# Patient Record
Sex: Female | Born: 2000 | Race: Asian | Hispanic: No | Marital: Single | Smoking: Never smoker
Health system: Southern US, Community
[De-identification: ages and names within clinical notes are randomized; demographics above are authoritative.]

## PROBLEM LIST (undated history)

## (undated) HISTORY — PX: NO PAST SURGERIES: SHX2092

---

## 2021-09-26 ENCOUNTER — Encounter (HOSPITAL_BASED_OUTPATIENT_CLINIC_OR_DEPARTMENT_OTHER): Payer: Self-pay

## 2021-09-26 ENCOUNTER — Other Ambulatory Visit: Payer: Self-pay

## 2021-09-26 ENCOUNTER — Emergency Department (HOSPITAL_BASED_OUTPATIENT_CLINIC_OR_DEPARTMENT_OTHER): Payer: 59

## 2021-09-26 ENCOUNTER — Emergency Department (HOSPITAL_BASED_OUTPATIENT_CLINIC_OR_DEPARTMENT_OTHER): Payer: 59 | Admitting: Radiology

## 2021-09-26 ENCOUNTER — Emergency Department (HOSPITAL_BASED_OUTPATIENT_CLINIC_OR_DEPARTMENT_OTHER)
Admission: EM | Admit: 2021-09-26 | Discharge: 2021-09-26 | Disposition: A | Discharge status: Home / Self Care | Payer: 59 | Attending: Emergency Medicine | Admitting: Emergency Medicine

## 2021-09-26 DIAGNOSIS — K8020 Calculus of gallbladder without cholecystitis without obstruction: Secondary | ICD-10-CM | POA: Diagnosis not present

## 2021-09-26 DIAGNOSIS — R1011 Right upper quadrant pain: Secondary | ICD-10-CM

## 2021-09-26 DIAGNOSIS — K8050 Calculus of bile duct without cholangitis or cholecystitis without obstruction: Secondary | ICD-10-CM

## 2021-09-26 DIAGNOSIS — R109 Unspecified abdominal pain: Secondary | ICD-10-CM | POA: Diagnosis present

## 2021-09-26 LAB — COMPREHENSIVE METABOLIC PANEL
ALT: 17 U/L (ref 0–44)
AST: 14 U/L — ABNORMAL LOW (ref 15–41)
Albumin: 4.6 g/dL (ref 3.5–5.0)
Alkaline Phosphatase: 46 U/L (ref 38–126)
Anion gap: 10 (ref 5–15)
BUN: 19 mg/dL (ref 6–20)
CO2: 24 mmol/L (ref 22–32)
Calcium: 9.4 mg/dL (ref 8.9–10.3)
Chloride: 105 mmol/L (ref 98–111)
Creatinine, Ser: 0.5 mg/dL (ref 0.44–1.00)
GFR, Estimated: >60 mL/min (ref >60)
Glucose, Bld: 105 mg/dL — ABNORMAL HIGH (ref 70–99)
Potassium: 3.7 mmol/L (ref 3.5–5.1)
Sodium: 139 mmol/L (ref 135–145)
Total Bilirubin: 0.3 mg/dL (ref 0.3–1.2)
Total Protein: 7.8 g/dL (ref 6.5–8.1)

## 2021-09-26 LAB — URINALYSIS, ROUTINE W REFLEX MICROSCOPIC
Bilirubin Urine: NEGATIVE
Glucose, UA: NEGATIVE mg/dL
Hgb urine dipstick: NEGATIVE
Ketones, ur: NEGATIVE mg/dL
Leukocytes,Ua: NEGATIVE
Nitrite: NEGATIVE
Protein, ur: NEGATIVE mg/dL
Specific Gravity, Urine: 1.022 (ref 1.005–1.030)
pH: 6.5 (ref 5.0–8.0)

## 2021-09-26 LAB — CBC WITH DIFFERENTIAL/PLATELET
Abs Immature Granulocytes: 0.02 10*3/uL (ref 0.00–0.07)
Basophils Absolute: 0 10*3/uL (ref 0.0–0.1)
Basophils Relative: 0 %
Eosinophils Absolute: 0.1 10*3/uL (ref 0.0–0.5)
Eosinophils Relative: 1 %
HCT: 42 % (ref 36.0–46.0)
Hemoglobin: 14 g/dL (ref 12.0–15.0)
Immature Granulocytes: 0 %
Lymphocytes Relative: 26 %
Lymphs Abs: 2.5 10*3/uL (ref 0.7–4.0)
MCH: 29.4 pg (ref 26.0–34.0)
MCHC: 33.3 g/dL (ref 30.0–36.0)
MCV: 88.2 fL (ref 80.0–100.0)
Monocytes Absolute: 0.7 10*3/uL (ref 0.1–1.0)
Monocytes Relative: 7 %
Neutro Abs: 6.5 10*3/uL (ref 1.7–7.7)
Neutrophils Relative %: 66 %
Platelets: 318 10*3/uL (ref 150–400)
RBC: 4.76 MIL/uL (ref 3.87–5.11)
RDW: 11.6 % (ref 11.5–15.5)
WBC: 9.8 10*3/uL (ref 4.0–10.5)
nRBC: 0 % (ref 0.0–0.2)

## 2021-09-26 LAB — LIPASE, BLOOD: Lipase: 39 U/L (ref 11–51)

## 2021-09-26 LAB — PREGNANCY, URINE: Preg Test, Ur: NEGATIVE

## 2021-09-26 MED ORDER — ONDANSETRON HCL 4 MG/2ML IJ SOLN
4.0000 mg | Freq: Once | INTRAMUSCULAR | Status: AC
  Administered 2021-09-26: 4 mg via INTRAVENOUS
  Filled 2021-09-26: qty 2

## 2021-09-26 MED ORDER — SODIUM CHLORIDE 0.9 % IV BOLUS
1000.0000 mL | Freq: Once | INTRAVENOUS | Status: AC
  Administered 2021-09-26: 1000 mL via INTRAVENOUS

## 2021-09-26 MED ORDER — ALUM & MAG HYDROXIDE-SIMETH 200-200-20 MG/5ML PO SUSP
30.0000 mL | Freq: Once | ORAL | Status: AC
  Administered 2021-09-26: 30 mL via ORAL
  Filled 2021-09-26: qty 30

## 2021-09-26 MED ORDER — OMEPRAZOLE 20 MG PO CPDR: 20.0000 mg | DELAYED_RELEASE_CAPSULE | Freq: Every day | ORAL | 0 refills | Status: DC

## 2021-09-26 MED ORDER — LIDOCAINE VISCOUS HCL 2 % MT SOLN
15.0000 mL | Freq: Once | OROMUCOSAL | Status: AC
  Administered 2021-09-26: 15 mL via ORAL
  Filled 2021-09-26: qty 15

## 2021-09-26 MED ORDER — ONDANSETRON 4 MG PO TBDP: 4.0000 mg | ORAL_TABLET | Freq: Three times a day (TID) | ORAL | 0 refills | Status: DC | PRN

## 2021-09-26 NOTE — Discharge Instructions (Signed)
Your ultrasound shows gallstones.  You should follow-up with the surgeon for removal of your gallbladder.  Avoid alcohol, caffeine, spicy foods, NSAID medications.  Return to the ED with worsening pain, fever, vomiting, any other concerns

## 2021-09-26 NOTE — ED Provider Notes (Signed)
[facility] EMERGENCY DEPT Provider Note   CSN: [id number] Arrival date & time: 09/26/21  0346     History Chief Complaint  Patient presents with   Abdominal Pain   Emesis    Sandra Koch is a 20 y.o. female.  Level 5 caveat for language barrier.  Patient wishes to use her sister as Nurse, learning disability and does not wish to use a Orthoptist. She presents with upper abdominal pain for the past 1 week that is progressively worsening.  The pain appears only at night and appears to wake her from sleep.  States the pain last from 2 AM until 6 AM constantly.  It resolves during the day and she is able to eat and drink normally.  She has had several episodes of vomiting with this pain on and off over the past week.  Denies any blood in her emesis.  Denies any black or bloody stools.  No fever.  No pain with urination or blood in the urine.  No vaginal bleeding or discharge. No history of acid reflux or ulcers.  Still has appendix and gallbladder. Denies any excessive alcohol or NSAID use.  The history is provided by the patient. The history is limited by a language barrier. A language interpreter was used.  Abdominal Pain Associated symptoms: nausea and vomiting   Associated symptoms: no chest pain, no cough, no diarrhea, no dysuria, no fever, no hematuria, no shortness of breath, no vaginal bleeding and no vaginal discharge   Emesis Associated symptoms: abdominal pain   Associated symptoms: no arthralgias, no cough, no diarrhea, no fever, no headaches and no myalgias       History reviewed. No pertinent past medical history.  There are no problems to display for this patient.   History reviewed. No pertinent surgical history.   OB History   No obstetric history on file.     No family history on file.  Social History   Tobacco Use   Smoking status: Never   Smokeless tobacco: Never    Home Medications Prior to Admission medications   Not on File     Allergies    Patient has no allergy information on record.  Review of Systems   Review of Systems  Constitutional:  Negative for activity change, appetite change and fever.  HENT:  Negative for congestion and rhinorrhea.   Respiratory:  Negative for cough, chest tightness and shortness of breath.   Cardiovascular:  Negative for chest pain.  Gastrointestinal:  Positive for abdominal pain, nausea and vomiting. Negative for diarrhea.  Genitourinary:  Negative for dysuria, hematuria, urgency, vaginal bleeding and vaginal discharge.  Musculoskeletal:  Negative for arthralgias and myalgias.  Neurological:  Negative for dizziness, weakness and headaches.   all other systems are negative except as noted in the HPI and PMH.   Physical Exam Updated Vital Signs BP (!) 140/93   Pulse 64   Temp 98.3 F (36.8 C) (Oral)   Resp 18   Ht 5\' 2"  (1.575 m)   Wt 56.7 kg   LMP 09/15/2021 (Exact Date)   SpO2 100%   BMI 22.86 kg/m   Physical Exam Vitals and nursing note reviewed.  Constitutional:      General: She is not in acute distress.    Appearance: She is well-developed.  HENT:     Head: Normocephalic and atraumatic.     Mouth/Throat:     Pharynx: No oropharyngeal exudate.  Eyes:     Conjunctiva/sclera: Conjunctivae normal.  Pupils: Pupils are equal, round, and reactive to light.  Neck:     Comments: No meningismus. Cardiovascular:     Rate and Rhythm: Normal rate and regular rhythm.     Heart sounds: Normal heart sounds. No murmur heard. Pulmonary:     Effort: Pulmonary effort is normal. No respiratory distress.     Breath sounds: Normal breath sounds.  Abdominal:     Palpations: Abdomen is soft.     Tenderness: There is abdominal tenderness. There is no guarding or rebound.     Comments: Epigastric and right upper quadrant tenderness, no guarding or rebound.  No lower abdominal tenderness  Musculoskeletal:        General: No tenderness. Normal range of motion.      Cervical back: Normal range of motion and neck supple.  Skin:    General: Skin is warm.  Neurological:     Mental Status: She is alert and oriented to person, place, and time.     Cranial Nerves: No cranial nerve deficit.     Motor: No abnormal muscle tone.     Coordination: Coordination normal.     Comments:  5/5 strength throughout. CN 2-12 intact.Equal grip strength.   Psychiatric:        Behavior: Behavior normal.    ED Results / Procedures / Treatments   Labs (all labs ordered are listed, but only abnormal results are displayed) Labs Reviewed  COMPREHENSIVE METABOLIC PANEL - Abnormal; Notable for the following components:      Result Value   Glucose, Bld 105 (*)    AST 14 (*)    All other components within normal limits  CBC WITH DIFFERENTIAL/PLATELET  LIPASE, BLOOD  URINALYSIS, ROUTINE W REFLEX MICROSCOPIC  PREGNANCY, URINE    EKG None  Radiology DG Abdomen Acute W/Chest  Result Date: 09/26/2021 CLINICAL DATA:  Abdominal pain. EXAM: DG ABDOMEN ACUTE WITH 1 VIEW CHEST COMPARISON:  None. FINDINGS: The cardiac silhouette, mediastinal and hilar contours are normal. The lungs are clear. No pleural effusions. Scattered air in the small bowel and colon but no bowel distension. No free air. The soft tissue shadows are maintained. No worrisome calcifications. The bony structures are unremarkable. IMPRESSION: 1. No acute cardiopulmonary findings. 2. Unremarkable abdominal radiographs. No findings for obstruction or perforation. Electronically Signed   By: Rudie Meyer M.D.   On: 09/26/2021 06:45   US Abdomen Limited RUQ (LIVER/GB)  Result Date: 09/26/2021 CLINICAL DATA:  Right upper quadrant pain EXAM: ULTRASOUND ABDOMEN LIMITED RIGHT UPPER QUADRANT COMPARISON:  None. FINDINGS: Gallbladder: Shadowing gallstones. Mild gallbladder wall thickening and heterogeneity, up to 5 mm in thickness. Modest distension of the gallbladder. No focal tenderness by sonographer exam. Common bile  duct: Diameter: 4 mm Liver: No focal lesion identified. Within normal limits in parenchymal echogenicity. Portal vein is patent on color Doppler imaging with normal direction of blood flow towards the liver. IMPRESSION: Cholelithiasis. Mild gallbladder wall thickening but no focal tenderness typical of acute cholecystitis. Electronically Signed   By: Tiburcio Pea M.D.   On: 09/26/2021 06:22    Procedures Procedures   Medications Ordered in ED Medications  alum & mag hydroxide-simeth (MAALOX/MYLANTA) 200-200-20 MG/5ML suspension 30 mL (has no administration in time range)    And  lidocaine (XYLOCAINE) 2 % viscous mouth solution 15 mL (has no administration in time range)  ondansetron (ZOFRAN) injection 4 mg (has no administration in time range)  sodium chloride 0.9 % bolus 1,000 mL (has no administration in time range)  ED Course  I have reviewed the triage vital signs and the nursing notes.  Pertinent labs & imaging results that were available during my care of the patient were reviewed by me and considered in my medical decision making (see chart for details).    MDM Rules/Calculators/A&P                          Upper abdominal pain with intermittent nausea and vomiting for the past 1 week and appears only at night. Vital stable.  Abdominal soft without peritoneal signs  Consider gastritis, esophagitis, pancreatitis, gallbladder etiology of pain.  Labs show no leukocytosis.  LFTs and lipase are normal.  Ultrasound shows gallstones and mild gallbladder wall thickening but no evidence of cholecystitis.  On recheck pain has resolved.  Patient able to tolerate p.o.  D/w Dr Derrell Lolling who agrees that outpatient evaluation is reasonable so long as pain resolved and tolerating PO.  Refer to surgery for consideration of cholecystectomy.  Start PPI.  Avoid alcohol, caffeine, NSAID medications. Return to the ED with worsening pain, intractable vomiting, fever, or any other concerns.     Final Clinical Impression(s) / ED Diagnoses Final diagnoses:  RUQ pain  Biliary colic    Rx / DC Orders ED Discharge Orders     None        Brighid Koch, Jeannett Senior, MD 09/26/21 [phone]

## 2021-09-26 NOTE — ED Notes (Signed)
Sandra Koch  [phone] assisted with discharge instructions, Pt verbalizes understanding follow up and medications.

## 2021-09-26 NOTE — ED Triage Notes (Signed)
Pt only speaks vietnamese. Pt reports epigastric pain and vomiting since last week and got worse today - denies fever.

## 2021-10-16 ENCOUNTER — Encounter: Payer: Self-pay | Admitting: Physician Assistant

## 2021-11-07 ENCOUNTER — Encounter: Payer: Self-pay | Admitting: Physician Assistant

## 2021-11-07 ENCOUNTER — Ambulatory Visit (INDEPENDENT_AMBULATORY_CARE_PROVIDER_SITE_OTHER): Payer: 59 | Admitting: Physician Assistant

## 2021-11-07 VITALS — BP 106/70 | HR 72 | Ht 62.0 in | Wt 144.4 lb

## 2021-11-07 DIAGNOSIS — R1013 Epigastric pain: Secondary | ICD-10-CM

## 2021-11-07 NOTE — Progress Notes (Signed)
I agree with the assessment and plan as outlined by Ms. Lemmon. 

## 2021-11-07 NOTE — Progress Notes (Signed)
Chief Complaint: Epigastric abdominal pain  HPI:    Sandra Koch is a 20 year old [place] female with a past medical history as listed below, who was referred to me by Diamantina Monks, MD for a complaint of epigastric abdominal pain.    09/26/2021 patient seen in the ER for abdominal pain and vomiting.  At that time described abdominal pain for the past week which is progressively worsening.  Apparently lasted from 2 AM until 6 AM constantly resolved during the day and she was able to eat and drink.  Several episodes of vomiting.  Labs at that time with an AST of 14 otherwise normal CMP, CBC, lipase, urinalysis and urine pregnancy test.  Abdominal x-ray with no acute findings.  Right upper quadrant ultrasound with cholelithiasis and mild gallbladder wall thickening but no focal tenderness typical of acute cholecystitis.  Patient was referred to surgery for consideration of cholecystectomy and started on a PPI.    Today, the patient presents to clinic accompanied by her sister who acts as her interpreter.  She explains that at the beginning of October she started with an epigastric pain which seemed to wake her from her sleep around 2:00 in the morning and last for 6 hours or so and then gradually go away allowing her to rest.  She went to the ER as above and was given Omeprazole 20 mg daily, she took this for a while and this helped slightly with her pain.  She then followed with a surgeon at [facility] in regards to her cholelithiasis and was told that likely this pain was all from her stomach.  Over the past 2 to 3 weeks the patient has felt much better.  In fact she was prescribed Pantoprazole 40 mg by her PCP after her Omeprazole 20 mg ran out and she was using this until about a week ago when she stopped.  She has had no further pain or nausea or any symptoms at all.  Does admitting to use NSAIDs and eat spicy food.    Denies fever, chills, weight loss, change in bowel habits or any continued  symptoms.  History reviewed. No pertinent past medical history.  Past Surgical History:  Procedure Laterality Date   NO PAST SURGERIES      Current Outpatient Medications  Medication Sig Dispense Refill   pantoprazole (PROTONIX) 40 MG tablet Take 40 mg by mouth daily.     No current facility-administered medications for this visit.    Allergies as of 11/07/2021   (No Known Allergies)    Family History  Problem Relation Age of Onset   Hypertension Mother    Diabetes Mother    Colon cancer Neg Hx    Esophageal cancer Neg Hx    Pancreatic cancer Neg Hx    Stomach cancer Neg Hx     Social History   Socioeconomic History   Marital status: Single    Spouse name: Not on file   Number of children: Not on file   Years of education: Not on file   Highest education level: Not on file  Occupational History   Not on file  Tobacco Use   Smoking status: Never   Smokeless tobacco: Never  Vaping Use   Vaping Use: Never used  Substance and Sexual Activity   Alcohol use: Yes    Comment: occ/rare   Drug use: Never   Sexual activity: Not on file  Other Topics Concern   Not on file  Social History Narrative  Not on file   Social Determinants of Health   Financial Resource Strain: Not on file  Food Insecurity: Not on file  Transportation Needs: Not on file  Physical Activity: Not on file  Stress: Not on file  Social Connections: Not on file  Intimate Partner Violence: Not on file    Review of Systems:    Constitutional: No weight loss, fever or chills Skin: No rash  Cardiovascular: No chest pain Respiratory: No SOB Gastrointestinal: See HPI and otherwise negative Genitourinary: No dysuria  Neurological: No headache, dizziness or syncope Musculoskeletal: No new muscle or joint pain Hematologic: No bleeding Psychiatric: No history of depression or anxiety   Physical Exam:  Vital signs: BP 106/70   Pulse 72   Ht 5\' 2"  (1.575 m)   Wt 144 lb 6.4 oz (65.5 kg)    BMI 26.41 kg/m   Constitutional:   Pleasant female appears to be in NAD, Well developed, Well nourished, alert and cooperative Head:  Normocephalic and atraumatic. Eyes:   PEERL, EOMI. No icterus. Conjunctiva pink. Ears:  Normal auditory acuity. Neck:  Supple Throat: Oral cavity and pharynx without inflammation, swelling or lesion.  Respiratory: Respirations even and unlabored. Lungs clear to auscultation bilaterally.   No wheezes, crackles, or rhonchi.  Cardiovascular: Normal S1, S2. No MRG. Regular rate and rhythm. No peripheral edema, cyanosis or pallor.  Gastrointestinal:  Soft, nondistended, nontender. No rebound or guarding. Normal bowel sounds. No appreciable masses or hepatomegaly. Rectal:  Not performed.  Msk:  Symmetrical without gross deformities. Without edema, no deformity or joint abnormality.  Neurologic:  Alert and  oriented x4;  grossly normal neurologically.  Skin:   Dry and intact without significant lesions or rashes. Psychiatric:  Demonstrates good judgement and reason without abnormal affect or behaviors.  RELEVANT LABS AND IMAGING: CBC    Component Value Date/Time   WBC 9.8 09/26/2021 0400   RBC 4.76 09/26/2021 0400   HGB 14.0 09/26/2021 0400   HCT 42.0 09/26/2021 0400   PLT 318 09/26/2021 0400   MCV 88.2 09/26/2021 0400   MCH 29.4 09/26/2021 0400   MCHC 33.3 09/26/2021 0400   RDW 11.6 09/26/2021 0400   LYMPHSABS 2.5 09/26/2021 0400   MONOABS 0.7 09/26/2021 0400   EOSABS 0.1 09/26/2021 0400   BASOSABS 0.0 09/26/2021 0400    CMP     Component Value Date/Time   NA 139 09/26/2021 0400   K 3.7 09/26/2021 0400   CL 105 09/26/2021 0400   CO2 24 09/26/2021 0400   GLUCOSE 105 (H) 09/26/2021 0400   BUN 19 09/26/2021 0400   CREATININE 0.50 09/26/2021 0400   CALCIUM 9.4 09/26/2021 0400   PROT 7.8 09/26/2021 0400   ALBUMIN 4.6 09/26/2021 0400   AST 14 (L) 09/26/2021 0400   ALT 17 09/26/2021 0400   ALKPHOS 46 09/26/2021 0400   BILITOT 0.3  09/26/2021 0400   GFRNONAA >60 09/26/2021 0400    Assessment: 1.  Epigastric pain: Ultrasound at time of ER visit with Cholelithiasis, followed with general surgery who thought this was more gastritis, symptoms have resolved on Pantoprazole 40 mg daily; most likely gastritis  Plan: 1.  Discussed with patient that since her symptoms have resolved and she is no longer requiring medication then she can stay off of this.  Did review antireflux diet and lifestyle modifications for the future. 2.  Discussed that if her pain comes back she should restart her Pantoprazole 40 mg daily and use this for 2 to 3  weeks at a time. 3.  Patient to follow in clinic with [place] as needed.  She was assigned to Dr. Leonides Schanz today.  Hyacinth Meeker, PA-C [facility] 11/07/2021, 10:45 AM  Cc: Diamantina Monks, MD

## 2021-11-07 NOTE — Patient Instructions (Addendum)
If you are age 22 or younger, your body mass index should be between 19-25. Your Body mass index is 26.41 kg/m. If this is out of the aformentioned range listed, please consider follow up with your Primary Care Provider.   ________________________________________________________  The [facility] providers would like to encourage you to use [facility] to communicate with providers for non-urgent requests or questions.  Due to long hold times on the telephone, sending your provider a message by [facility] may be a faster and more efficient way to get a response.  Please allow 48 business hours for a response.  Please remember that this is for non-urgent requests.  _______________________________________________________  Sandra Koch will follow up in our office as needed.  Thank you for entrusting me with your care and choosing [facility].  Hyacinth Meeker, PA-C

## 2022-01-16 ENCOUNTER — Other Ambulatory Visit (HOSPITAL_COMMUNITY): Payer: Self-pay | Admitting: Surgery

## 2022-01-16 ENCOUNTER — Other Ambulatory Visit: Payer: Self-pay | Admitting: Surgery

## 2022-01-16 DIAGNOSIS — R1084 Generalized abdominal pain: Secondary | ICD-10-CM

## 2022-01-31 ENCOUNTER — Ambulatory Visit (HOSPITAL_COMMUNITY): Payer: 59

## 2022-02-06 ENCOUNTER — Other Ambulatory Visit: Payer: Self-pay

## 2022-02-06 ENCOUNTER — Ambulatory Visit (HOSPITAL_COMMUNITY)
Admission: RE | Admit: 2022-02-06 | Discharge: 2022-02-06 | Disposition: A | Discharge status: Home / Self Care | Payer: 59 | Source: Ambulatory Visit | Attending: Surgery | Admitting: Surgery

## 2022-02-06 DIAGNOSIS — R1084 Generalized abdominal pain: Secondary | ICD-10-CM | POA: Insufficient documentation

## 2022-02-06 MED ORDER — TECHNETIUM TC 99M MEBROFENIN IV KIT
5.2000 | PACK | Freq: Once | INTRAVENOUS | Status: AC | PRN
  Administered 2022-02-06: 5.2 via INTRAVENOUS

## 2022-08-14 ENCOUNTER — Encounter: Payer: Self-pay | Admitting: Internal Medicine

## 2022-08-14 ENCOUNTER — Ambulatory Visit: Payer: 59 | Admitting: Internal Medicine

## 2022-08-14 VITALS — Temp 98.3°F | Resp 16 | Ht 62.0 in | Wt 153.0 lb

## 2022-08-14 DIAGNOSIS — R55 Syncope and collapse: Secondary | ICD-10-CM

## 2022-08-14 DIAGNOSIS — R0609 Other forms of dyspnea: Secondary | ICD-10-CM

## 2022-08-14 DIAGNOSIS — R002 Palpitations: Secondary | ICD-10-CM

## 2022-08-14 MED ORDER — SLOW-MAG 71.5-119 MG PO TBEC: 1.0000 | DELAYED_RELEASE_TABLET | Freq: Two times a day (BID) | ORAL | 2 refills | Status: DC

## 2022-08-14 NOTE — Progress Notes (Signed)
Primary Physician/Referring:  Jarrett Soho, PA-C  Patient ID: Sandra Koch, female    DOB: [birth year], 21 y.o.   MRN: [id number]  Chief Complaint  Patient presents with   New Patient (Initial Visit)   Palpitations   Referral   HPI:    Sandra Koch  is a 21 y.o. female with no reported past medical history who is here to establish care with cardiology.  Palpitations, dyspnea on exertion, and presyncope for about 7 years now.  Her labs and hormones have been checked extensively by her PCP.  Thyroid and all other labs have been normal throughout this time.  She does not smoke or drink alcohol.  She does not have any heart history in herself but her grandfather has had a heart attack.  Patient has never had an echocardiogram or event monitor in the past. She is getting concerned due to the length and severity of her palpitations now. At rest she denies chest pain, SOB, diaphoresis, edema, PND, orthopnea.  History reviewed. No pertinent past medical history. Past Surgical History:  Procedure Laterality Date   NO PAST SURGERIES     Family History  Problem Relation Age of Onset   Hypertension Mother    Diabetes Mother    Colon cancer Neg Hx    Esophageal cancer Neg Hx    Pancreatic cancer Neg Hx    Stomach cancer Neg Hx     Social History   Tobacco Use   Smoking status: Never   Smokeless tobacco: Never  Substance Use Topics   Alcohol use: Yes    Comment: occ/rare   Marital Status: Single  ROS  Review of Systems  Cardiovascular:  Positive for chest pain, dyspnea on exertion, irregular heartbeat, near-syncope and palpitations.  Neurological:  Positive for light-headedness.  All other systems reviewed and are negative. Objective  Temperature 98.3 F (36.8 C), temperature source Temporal, resp. rate 16, height 5\' 2"  (1.575 m), weight 153 lb (69.4 kg), SpO2 99 %. Body mass index is 27.98 kg/m.     08/14/2022    2:23 PM 11/07/2021   10:14 AM 09/26/2021     8:30 AM  Vitals with BMI  Height 5\' 2"  5\' 2"    Weight 153 lbs 144 lbs 6 oz   BMI 27.98 26.4   Systolic  106 103  Diastolic  70 68  Pulse  72 63     Physical Exam Vitals and nursing note reviewed.  Constitutional:      Appearance: Normal appearance.  HENT:     Head: Normocephalic and atraumatic.  Neck:     Vascular: No carotid bruit.  Cardiovascular:     Rate and Rhythm: Normal rate and regular rhythm.     Pulses: Normal pulses.     Heart sounds: Normal heart sounds. No murmur heard.    No gallop.  Pulmonary:     Effort: Pulmonary effort is normal.     Breath sounds: Normal breath sounds.  Abdominal:     General: Abdomen is flat. Bowel sounds are normal.     Palpations: Abdomen is soft.  Musculoskeletal:     Right lower leg: No edema.     Left lower leg: No edema.  Skin:    General: Skin is warm and dry.  Neurological:     Mental Status: She is alert.   Medications and allergies  No Known Allergies   Medication list after today's encounter   Current Outpatient Medications:    magnesium  chloride (SLOW-MAG) 64 MG TBEC SR tablet, Take 1 tablet (64 mg total) by mouth 2 (two) times daily., Disp: 60 tablet, Rfl: 2   metronidazole (FLAGYL) 375 MG capsule, Take 375 mg by mouth 2 (two) times daily., Disp: , Rfl:    omeprazole (PRILOSEC) 20 MG capsule, Take 20 mg by mouth daily., Disp: , Rfl:    tetracycline (SUMYCIN) 500 MG capsule, Take 500 mg by mouth 3 (three) times daily., Disp: , Rfl:   Laboratory examination:   Lab Results  Component Value Date   NA 139 09/26/2021   K 3.7 09/26/2021   CO2 24 09/26/2021   GLUCOSE 105 (H) 09/26/2021   BUN 19 09/26/2021   CREATININE 0.50 09/26/2021   CALCIUM 9.4 09/26/2021   GFRNONAA >60 09/26/2021       Latest Ref Rng & Units 09/26/2021    4:00 AM  CMP  Glucose 70 - 99 mg/dL 025   BUN 6 - 20 mg/dL 19   Creatinine 4.27 - 1.00 mg/dL 0.62   Sodium 376 - 283 mmol/L 139   Potassium 3.5 - 5.1 mmol/L 3.7   Chloride 98 - 111  mmol/L 105   CO2 22 - 32 mmol/L 24   Calcium 8.9 - 10.3 mg/dL 9.4   Total Protein 6.5 - 8.1 g/dL 7.8   Total Bilirubin 0.3 - 1.2 mg/dL 0.3   Alkaline Phos 38 - 126 U/L 46   AST 15 - 41 U/L 14   ALT 0 - 44 U/L 17       Latest Ref Rng & Units 09/26/2021    4:00 AM  CBC  WBC 4.0 - 10.5 K/uL 9.8   Hemoglobin 12.0 - 15.0 g/dL 15.1   Hematocrit 76.1 - 46.0 % 42.0   Platelets 150 - 400 K/uL 318     Lipid Panel No results for input(s): "CHOL", "TRIG", "LDLCALC", "VLDL", "HDL", "CHOLHDL", "LDLDIRECT" in the last 8760 hours.  HEMOGLOBIN A1C No results found for: "HGBA1C", "MPG" TSH No results for input(s): "TSH" in the last 8760 hours.  External labs:     Radiology:    Cardiac Studies:   No results found for this or any previous visit from the past 1095 days.     No results found for this or any previous visit from the past 1095 days.     EKG:   08/14/22 NSR, 60bpm    Assessment     ICD-10-CM   1. Palpitations  R00.2 EKG 12-Lead    [facility] ECHOCARDIOGRAM COMPLETE    LONG TERM MONITOR (3-14 DAYS)    2. DOE (dyspnea on exertion)  R06.09 [facility] ECHOCARDIOGRAM COMPLETE    LONG TERM MONITOR (3-14 DAYS)    3. Syncope and collapse  R55 [facility] ECHOCARDIOGRAM COMPLETE    LONG TERM MONITOR (3-14 DAYS)       Orders Placed This Encounter  Procedures   LONG TERM MONITOR (3-14 DAYS)    Standing Status:   Future    Standing Expiration Date:   08/15/2023    Order Specific Question:   Where should this test be performed?    Answer:   [facility]    Order Specific Question:   Does the patient have an implanted cardiac device?    Answer:   No    Order Specific Question:   Prescribed days of wear    Answer:   51    Order Specific Question:   Type of enrollment    Answer:   Clinic Enrollment  Order Specific Question:   Release to patient    Answer:   Immediate   EKG 12-Lead   [facility]V ECHOCARDIOGRAM COMPLETE    Standing Status:   Future    Standing Expiration Date:    08/15/2023    Meds ordered this encounter  Medications   magnesium chloride (SLOW-MAG) 64 MG TBEC SR tablet    Sig: Take 1 tablet (64 mg total) by mouth 2 (two) times daily.    Dispense:  60 tablet    Refill:  2    Medications Discontinued During This Encounter  Medication Reason   pantoprazole (PROTONIX) 40 MG tablet      Recommendations:   Sandra Koch is a 21 y.o.  female with palpitations, DOE, and syncope   Send Slow-Mag to pharmacy to help with palpitations HR and BP too low to start on beta-blocker for symptoms Encourage low-sodium diet, less than 2000 mg daily. Schedule imaging tests in office - echocardiogram. Event monitor ordered as patient has been suffering from palpitations for 7 years now. Follow-up in 1-2 months or sooner if needed.     Clotilde Dieter, DO, [facility]  08/14/2022, 2:40 PM Office: [phone] Pager: [phone]

## 2022-08-21 ENCOUNTER — Inpatient Hospital Stay: Payer: 59

## 2022-08-21 DIAGNOSIS — R002 Palpitations: Secondary | ICD-10-CM

## 2022-08-21 DIAGNOSIS — R0609 Other forms of dyspnea: Secondary | ICD-10-CM

## 2022-08-21 DIAGNOSIS — R55 Syncope and collapse: Secondary | ICD-10-CM

## 2022-08-22 ENCOUNTER — Inpatient Hospital Stay: Payer: 59

## 2022-08-23 ENCOUNTER — Inpatient Hospital Stay: Payer: 59

## 2022-09-06 ENCOUNTER — Ambulatory Visit: Payer: 59

## 2022-09-06 DIAGNOSIS — R002 Palpitations: Secondary | ICD-10-CM

## 2022-09-06 DIAGNOSIS — R0609 Other forms of dyspnea: Secondary | ICD-10-CM

## 2022-09-06 DIAGNOSIS — R55 Syncope and collapse: Secondary | ICD-10-CM

## 2022-09-13 DIAGNOSIS — R55 Syncope and collapse: Secondary | ICD-10-CM | POA: Diagnosis not present

## 2022-09-13 DIAGNOSIS — R002 Palpitations: Secondary | ICD-10-CM | POA: Diagnosis not present

## 2022-10-30 ENCOUNTER — Ambulatory Visit: Payer: 59 | Admitting: Internal Medicine

## 2023-08-13 DIAGNOSIS — K8020 Calculus of gallbladder without cholecystitis without obstruction: Secondary | ICD-10-CM | POA: Diagnosis not present

## 2023-08-13 DIAGNOSIS — Z13220 Encounter for screening for lipoid disorders: Secondary | ICD-10-CM | POA: Diagnosis not present

## 2023-08-13 DIAGNOSIS — Z6832 Body mass index (BMI) 32.0-32.9, adult: Secondary | ICD-10-CM | POA: Diagnosis not present

## 2023-08-13 DIAGNOSIS — Z0000 Encounter for general adult medical examination without abnormal findings: Secondary | ICD-10-CM | POA: Diagnosis not present

## 2023-09-18 IMAGING — US US ABDOMEN LIMITED
1 series · 14 of 25 positions shown · non-contrast
Comparison: None.

CLINICAL DATA: Right upper quadrant pain

EXAM:
ULTRASOUND ABDOMEN LIMITED RIGHT UPPER QUADRANT

[Series 1: us abdomen limited ruq (liver/gb) · 14 of 43 slices shown]
[im 1/43]
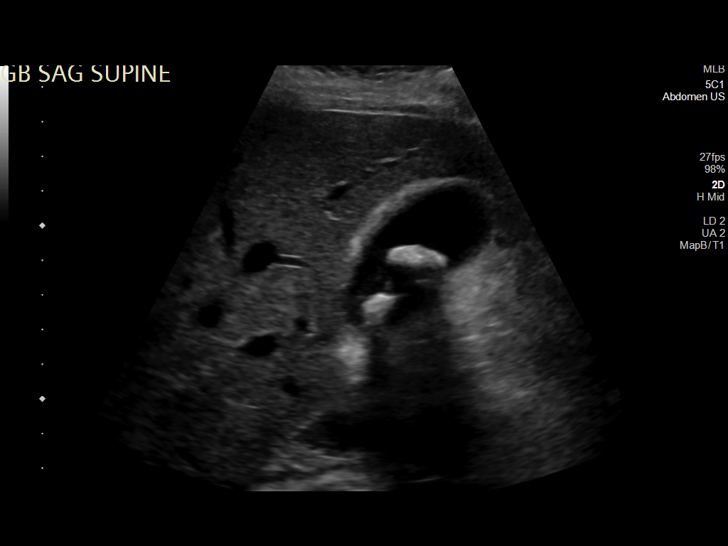
[im 4/43]
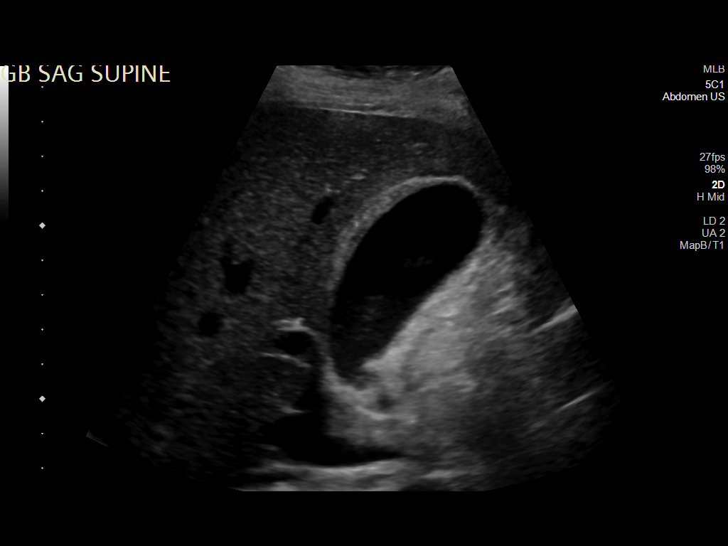
[im 8/43]
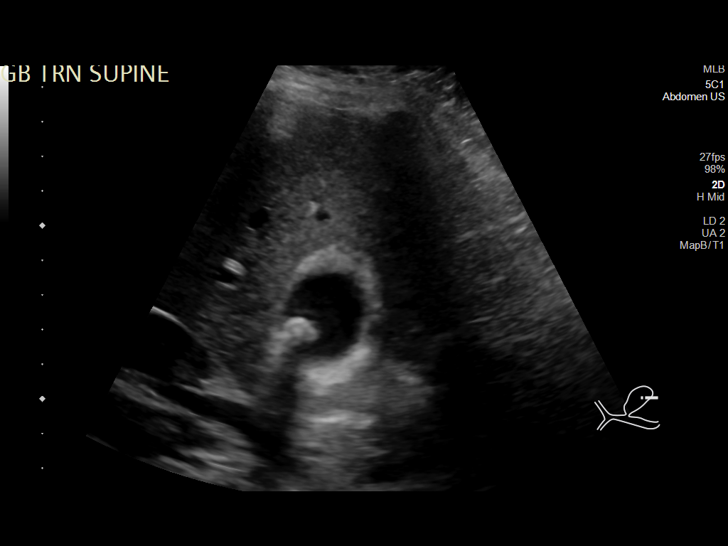
[im 11/43]
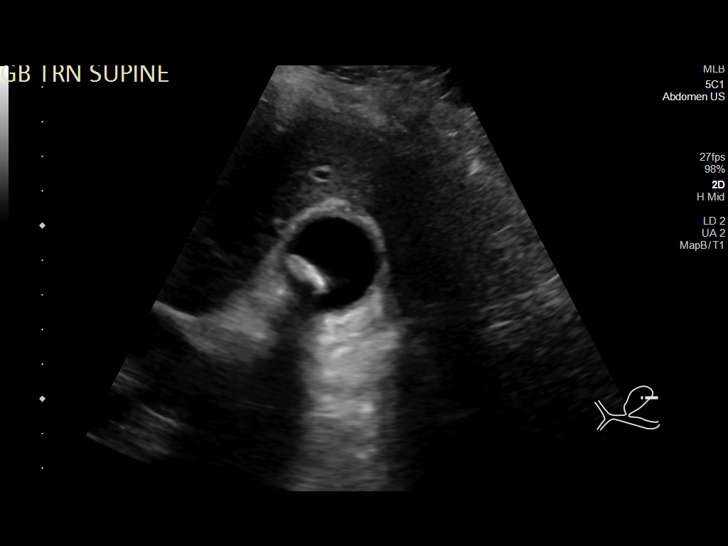
[im 15/43]
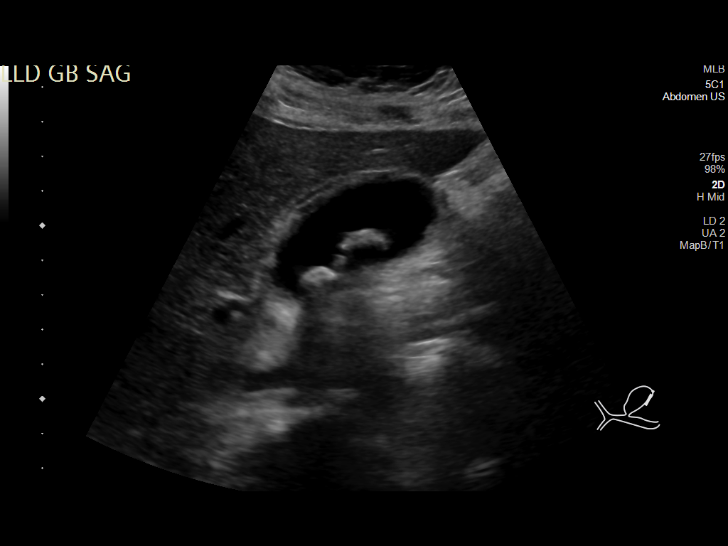
[im 16/43]
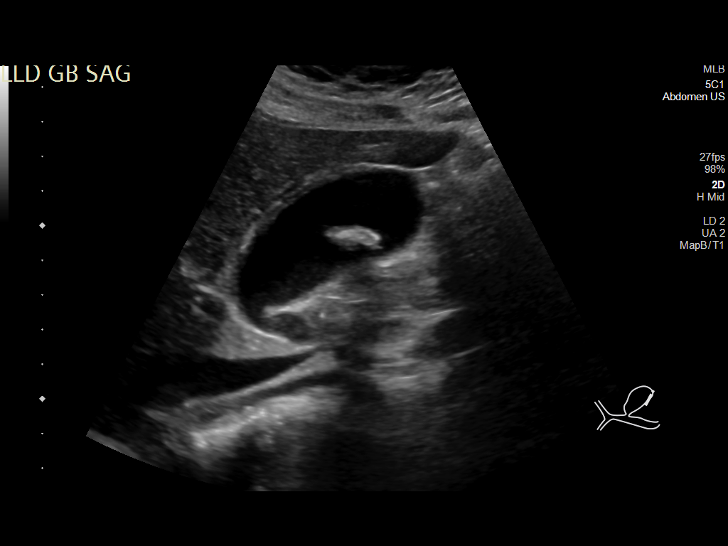
[im 20/43]
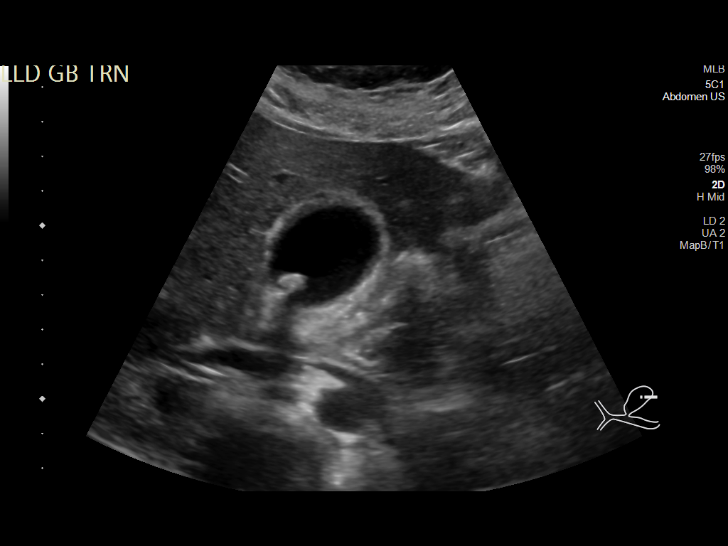
[im 23/43]
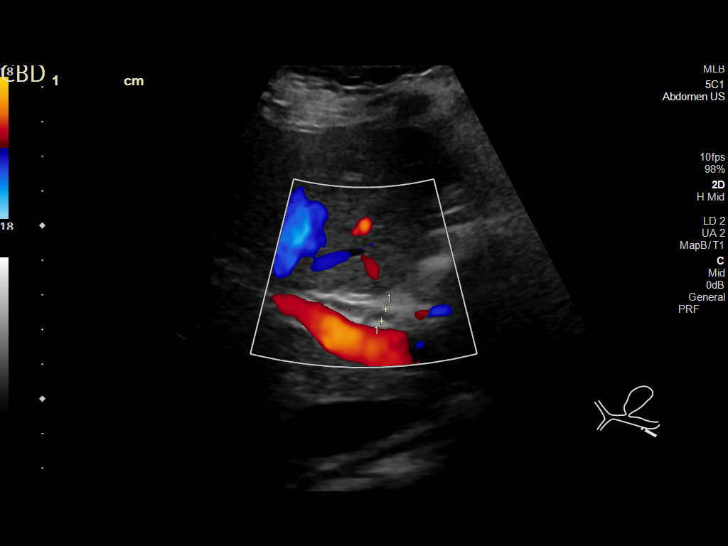
[im 27/43]
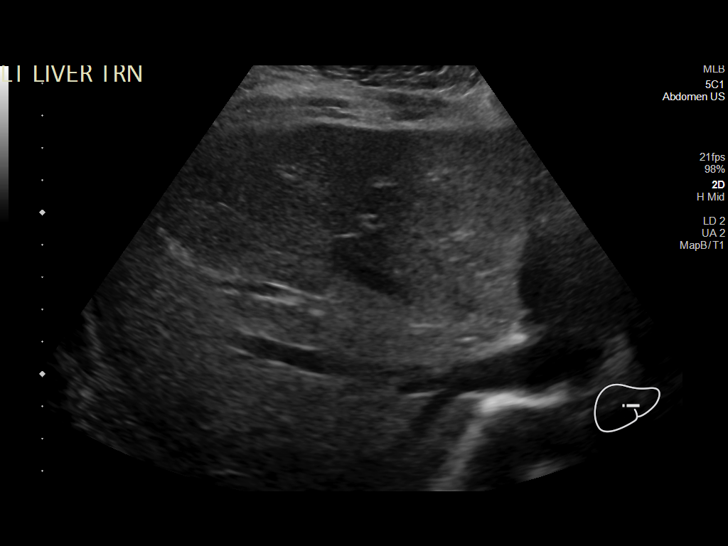
[im 29/43]
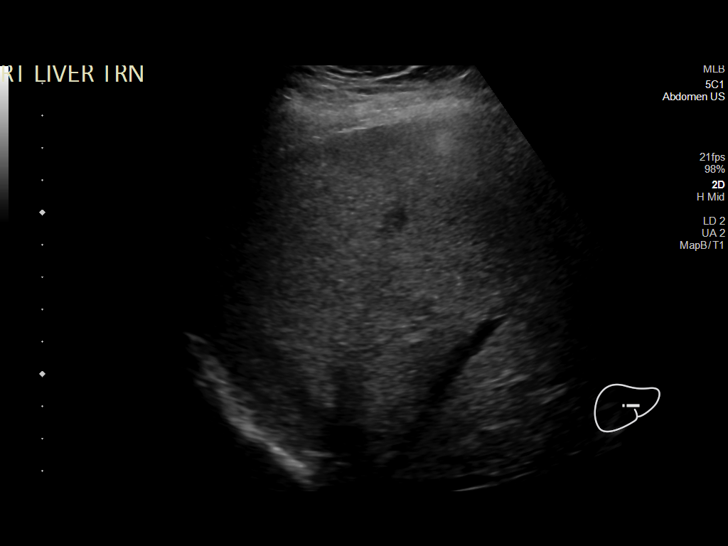
[im 32/43]
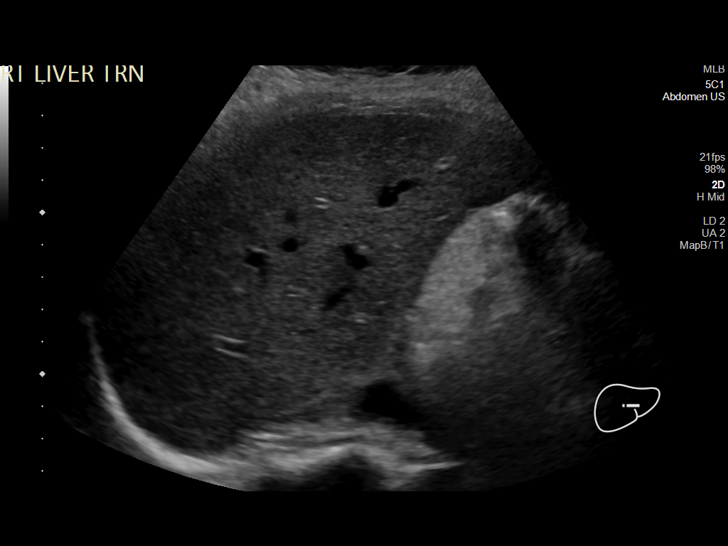
[im 36/43]
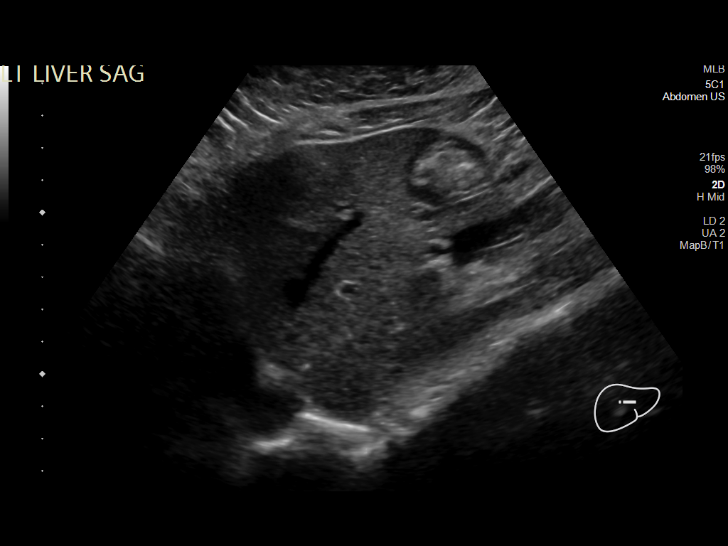
[im 39/43]
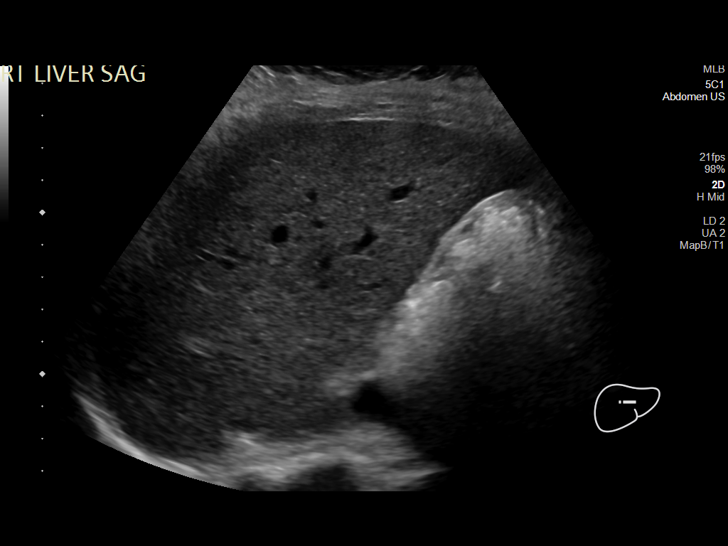
[im 43/43]
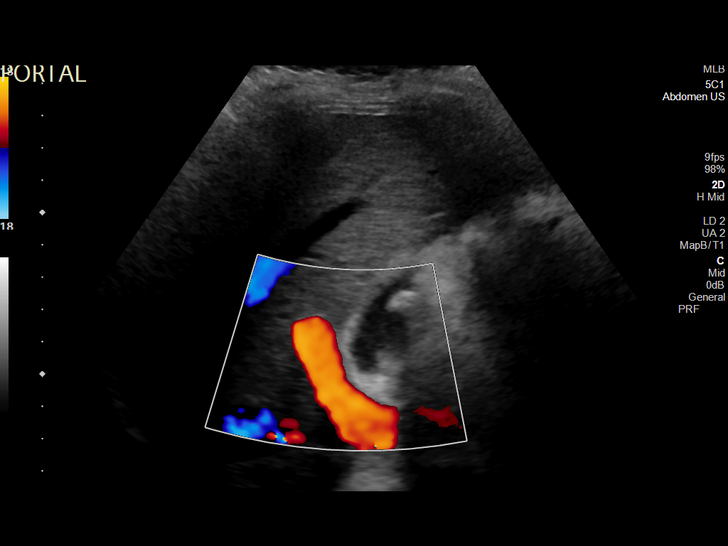

[14 of 25 positions shown; findings below may reference images not displayed]

FINDINGS: Gallbladder:

Shadowing gallstones. Mild gallbladder wall thickening and
heterogeneity, up to 5 mm in thickness. Modest distension of the
gallbladder. No focal tenderness by sonographer exam.

Common bile duct:

Diameter: 4 mm

Liver:

No focal lesion identified. Within normal limits in parenchymal
echogenicity. Portal vein is patent on color Doppler imaging with
normal direction of blood flow towards the liver.
IMPRESSION: Cholelithiasis. Mild gallbladder wall thickening but no focal
tenderness typical of acute cholecystitis.

## 2023-09-18 IMAGING — DX DG ABDOMEN ACUTE W/ 1V CHEST
3 series · 3 of 3 positions shown · non-contrast
Comparison: None.

CLINICAL DATA: Abdominal pain.

EXAM:
DG ABDOMEN ACUTE WITH 1 VIEW CHEST

[chest pa]
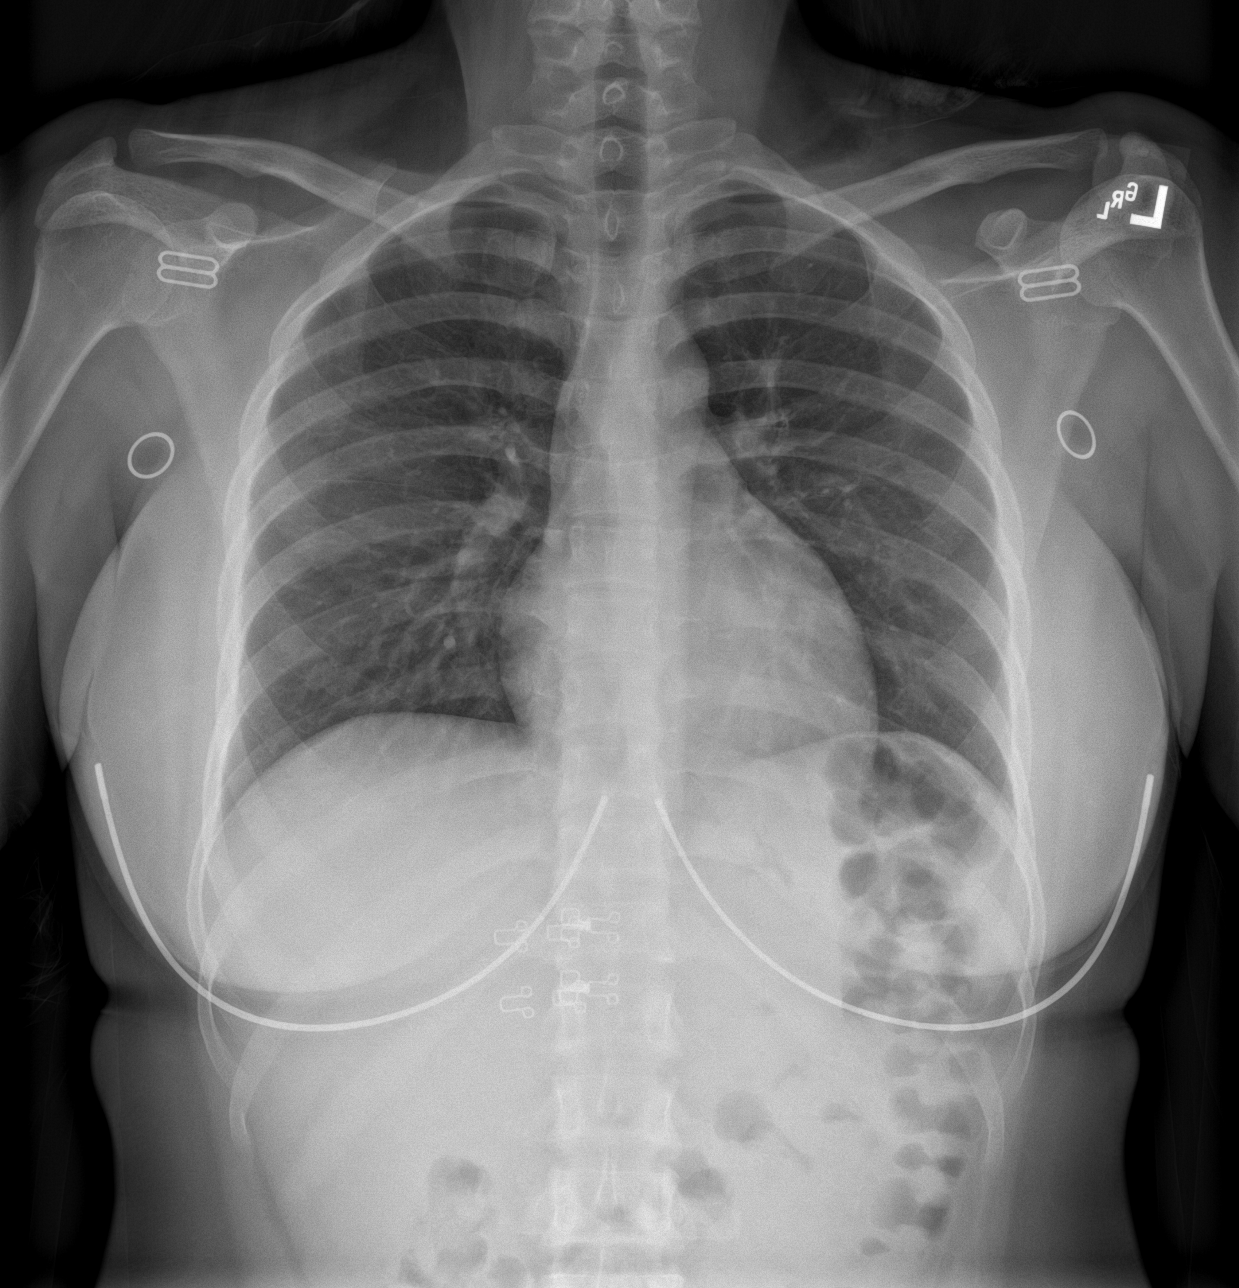

[abdomen kub]
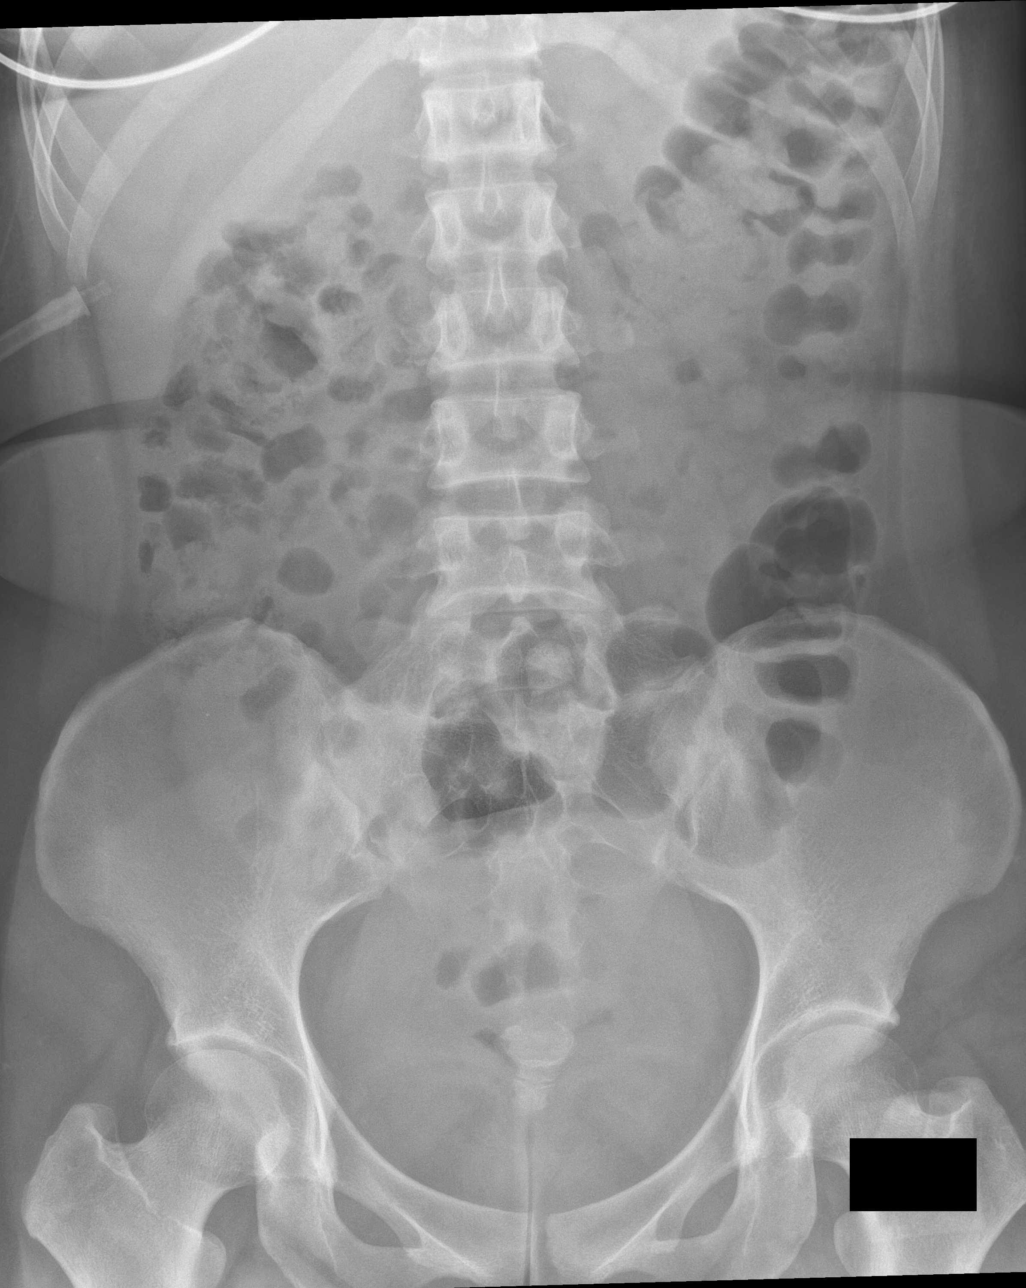

[abdomen erect]
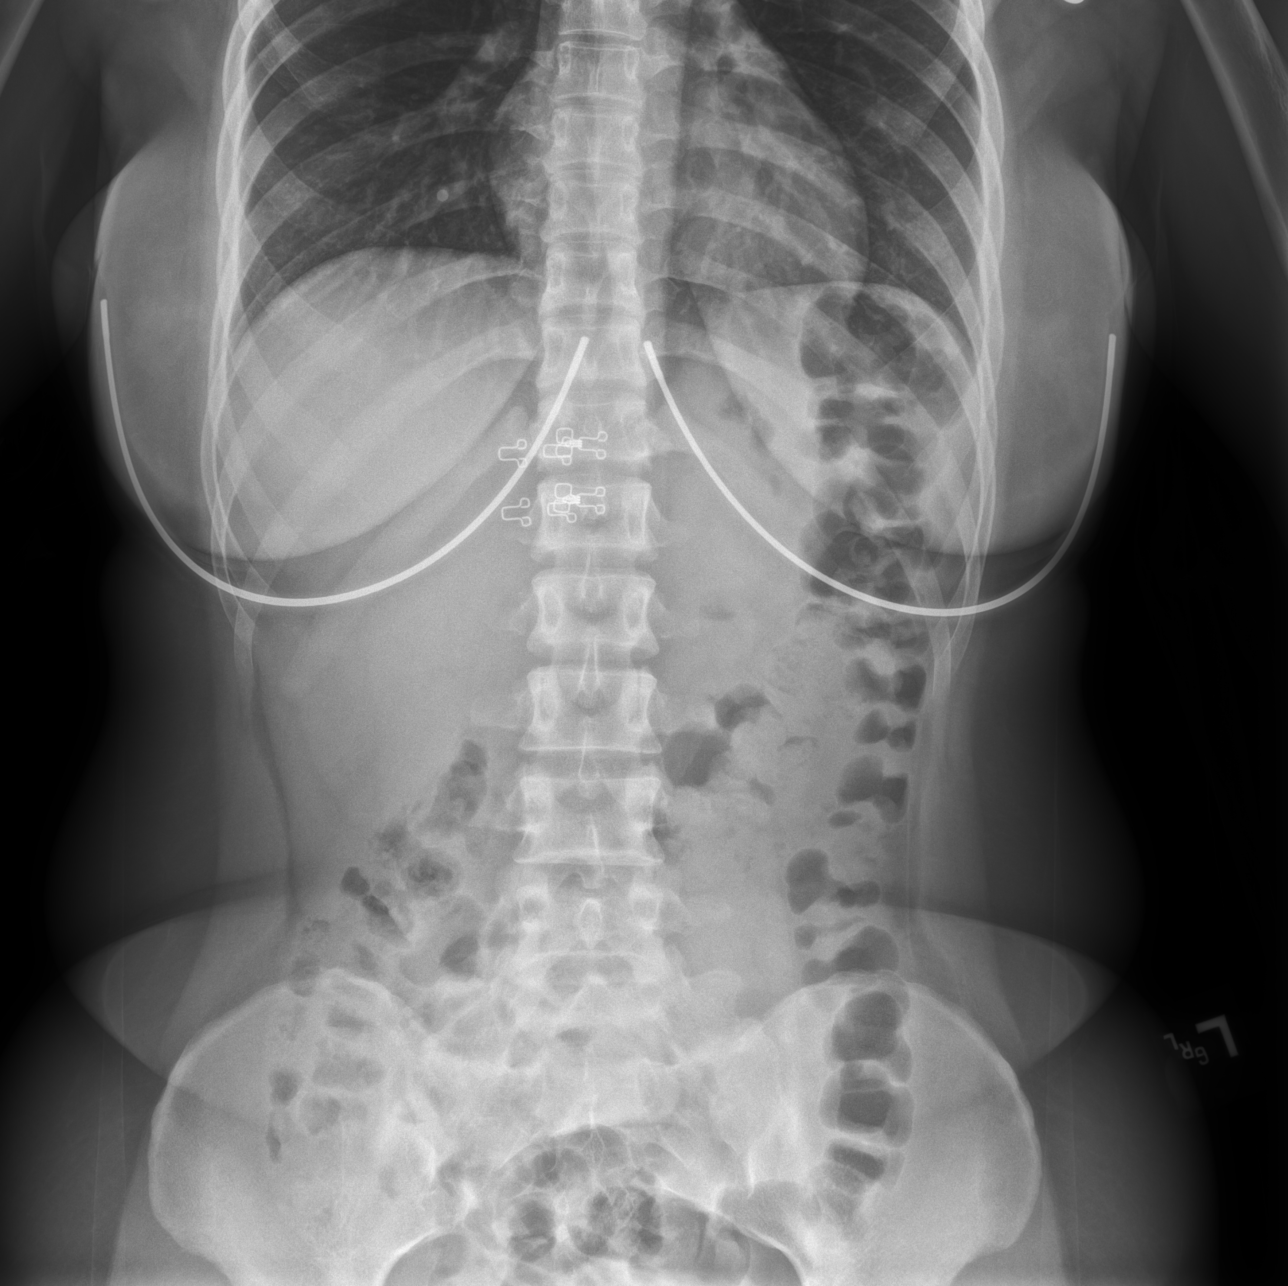

[3 of 3 positions shown; findings below may reference images not displayed]

FINDINGS: The cardiac silhouette, mediastinal and hilar contours are normal.
The lungs are clear. No pleural effusions.

Scattered air in the small bowel and colon but no bowel distension.
No free air. The soft tissue shadows are maintained. No worrisome
calcifications. The bony structures are unremarkable.
IMPRESSION: 1. No acute cardiopulmonary findings.
2. Unremarkable abdominal radiographs. No findings for obstruction
or perforation.

## 2023-10-03 DIAGNOSIS — K8020 Calculus of gallbladder without cholecystitis without obstruction: Secondary | ICD-10-CM | POA: Diagnosis not present

## 2023-11-04 ENCOUNTER — Ambulatory Visit: Payer: Self-pay | Admitting: Surgery

## 2023-11-04 NOTE — Anesthesia Preprocedure Evaluation (Signed)
Anesthesia Evaluation  Patient identified by MRN, date of birth, ID band Patient awake    Reviewed: Allergy & Precautions, NPO status , Patient's Chart, lab work & pertinent test results  History of Anesthesia Complications Negative for: history of anesthetic complications  Airway Mallampati: III  TM Distance: >3 FB Neck ROM: Full    Dental  (+) Dental Advisory Given   Pulmonary neg pulmonary ROS   Pulmonary exam normal breath sounds clear to auscultation       Cardiovascular (-) hypertension(-) angina (-) CAD, (-) Past MI, (-) Cardiac Stents and (-) CABG + dysrhythmias (palpitations)  Rhythm:Regular Rate:Normal  Echocardiogram 09/06/2022:  Left ventricle cavity is normal in size and wall thickness. Normal global  wall motion. Normal LV systolic function with EF 56%. Normal diastolic  filling pattern.  No significant valvular abnormality.  No evidence of pulmonary hypertension.     Neuro/Psych negative neurological ROS     GI/Hepatic Neg liver ROS,neg GERD  ,,cholelithiasis   Endo/Other  negative endocrine ROS    Renal/GU negative Renal ROS     Musculoskeletal   Abdominal   Peds  Hematology negative hematology ROS (+)            Anesthesia Other Findings   Reproductive/Obstetrics                             Anesthesia Physical Anesthesia Plan  ASA: 2  Anesthesia Plan: General   Post-op Pain Management: Tylenol PO (pre-op)*   Induction: Intravenous  PONV Risk Score and Plan: 3 and Ondansetron, Dexamethasone and Treatment may vary due to age or medical condition  Airway Management Planned: Oral ETT  Additional Equipment:   Intra-op Plan:   Post-operative Plan: Extubation in OR  Informed Consent: I have reviewed the patients History and Physical, chart, labs and discussed the procedure including the risks, benefits and alternatives for the proposed anesthesia with the  patient or authorized representative who has indicated his/her understanding and acceptance.     Dental advisory given  Plan Discussed with: CRNA and Surgeon  Anesthesia Plan Comments: (Risks of general anesthesia discussed including, but not limited to, sore throat, hoarse voice, chipped/damaged teeth, injury to vocal cords, nausea and vomiting, allergic reactions, lung infection, heart attack, stroke, and death. All questions answered. )       Anesthesia Quick Evaluation

## 2023-11-05 ENCOUNTER — Ambulatory Visit (HOSPITAL_BASED_OUTPATIENT_CLINIC_OR_DEPARTMENT_OTHER): Payer: 59 | Admitting: Anesthesiology

## 2023-11-05 ENCOUNTER — Encounter (HOSPITAL_COMMUNITY)
Admission: RE | Disposition: A | Discharge status: Home / Self Care | Payer: Self-pay | Source: Home / Self Care | Attending: Surgery

## 2023-11-05 ENCOUNTER — Encounter (HOSPITAL_COMMUNITY): Payer: Self-pay | Admitting: Surgery

## 2023-11-05 ENCOUNTER — Other Ambulatory Visit: Payer: Self-pay

## 2023-11-05 ENCOUNTER — Ambulatory Visit (HOSPITAL_COMMUNITY)
Admission: RE | Admit: 2023-11-05 | Discharge: 2023-11-05 | Disposition: A | Discharge status: Home / Self Care | Payer: 59 | Attending: Surgery | Admitting: Surgery

## 2023-11-05 ENCOUNTER — Ambulatory Visit (HOSPITAL_COMMUNITY): Payer: 59 | Admitting: Anesthesiology

## 2023-11-05 DIAGNOSIS — K8020 Calculus of gallbladder without cholecystitis without obstruction: Secondary | ICD-10-CM

## 2023-11-05 DIAGNOSIS — K8010 Calculus of gallbladder with chronic cholecystitis without obstruction: Secondary | ICD-10-CM | POA: Diagnosis not present

## 2023-11-05 HISTORY — PX: CHOLECYSTECTOMY: SHX55

## 2023-11-05 LAB — POCT PREGNANCY, URINE: Preg Test, Ur: NEGATIVE

## 2023-11-05 LAB — CBC
HCT: 42 % (ref 36.0–46.0)
Hemoglobin: 13.6 g/dL (ref 12.0–15.0)
MCH: 28.9 pg (ref 26.0–34.0)
MCHC: 32.4 g/dL (ref 30.0–36.0)
MCV: 89.4 fL (ref 80.0–100.0)
Platelets: 267 10*3/uL (ref 150–400)
RBC: 4.7 MIL/uL (ref 3.87–5.11)
RDW: 11.9 % (ref 11.5–15.5)
WBC: 6 10*3/uL (ref 4.0–10.5)
nRBC: 0 % (ref 0.0–0.2)

## 2023-11-05 SURGERY — LAPAROSCOPIC CHOLECYSTECTOMY
Anesthesia: General | Site: Abdomen

## 2023-11-05 MED ORDER — SUGAMMADEX SODIUM 200 MG/2ML IV SOLN
INTRAVENOUS | Status: DC | PRN
  Administered 2023-11-05: 200 mg via INTRAVENOUS

## 2023-11-05 MED ORDER — CHLORHEXIDINE GLUCONATE CLOTH 2 % EX PADS: 6.0000 | MEDICATED_PAD | Freq: Once | CUTANEOUS | Status: DC

## 2023-11-05 MED ORDER — OXYCODONE HCL 5 MG PO TABS: 5.0000 mg | ORAL_TABLET | Freq: Once | ORAL | Status: DC | PRN

## 2023-11-05 MED ORDER — METHOCARBAMOL 750 MG PO TABS: 750.0000 mg | ORAL_TABLET | Freq: Four times a day (QID) | ORAL | 1 refills | Status: AC

## 2023-11-05 MED ORDER — ACETAMINOPHEN 500 MG PO TABS
1000.0000 mg | ORAL_TABLET | ORAL | Status: AC
  Administered 2023-11-05: 1000 mg via ORAL
  Filled 2023-11-05: qty 2

## 2023-11-05 MED ORDER — AMISULPRIDE (ANTIEMETIC) 5 MG/2ML IV SOLN: 10.0000 mg | Freq: Once | INTRAVENOUS | Status: DC | PRN

## 2023-11-05 MED ORDER — DEXAMETHASONE SODIUM PHOSPHATE 10 MG/ML IJ SOLN
INTRAMUSCULAR | Status: DC | PRN
  Administered 2023-11-05: 10 mg via INTRAVENOUS

## 2023-11-05 MED ORDER — MIDAZOLAM HCL 2 MG/2ML IJ SOLN
INTRAMUSCULAR | Status: DC | PRN
  Administered 2023-11-05: 2 mg via INTRAVENOUS

## 2023-11-05 MED ORDER — KETOROLAC TROMETHAMINE 30 MG/ML IJ SOLN
INTRAMUSCULAR | Status: DC | PRN
  Administered 2023-11-05: 30 mg via INTRAVENOUS

## 2023-11-05 MED ORDER — ACETAMINOPHEN 500 MG PO TABS: 1000.0000 mg | ORAL_TABLET | Freq: Four times a day (QID) | ORAL | 3 refills | Status: AC

## 2023-11-05 MED ORDER — FENTANYL CITRATE (PF) 250 MCG/5ML IJ SOLN
INTRAMUSCULAR | Status: AC
  Filled 2023-11-05: qty 5

## 2023-11-05 MED ORDER — SODIUM CHLORIDE 0.9% FLUSH: 10.0000 mL | Freq: Two times a day (BID) | INTRAVENOUS | Status: DC

## 2023-11-05 MED ORDER — 0.9 % SODIUM CHLORIDE (POUR BTL) OPTIME
TOPICAL | Status: DC | PRN
  Administered 2023-11-05: 1000 mL

## 2023-11-05 MED ORDER — BUPIVACAINE LIPOSOME 1.3 % IJ SUSP
INTRAMUSCULAR | Status: AC
Start: 2023-11-05 — End: ?
  Filled 2023-11-05: qty 20

## 2023-11-05 MED ORDER — ENOXAPARIN SODIUM 40 MG/0.4ML IJ SOSY
40.0000 mg | PREFILLED_SYRINGE | Freq: Once | INTRAMUSCULAR | Status: AC
  Administered 2023-11-05: 40 mg via SUBCUTANEOUS
  Filled 2023-11-05: qty 0.4

## 2023-11-05 MED ORDER — IBUPROFEN 600 MG PO TABS: 600.0000 mg | ORAL_TABLET | Freq: Four times a day (QID) | ORAL | 1 refills | Status: AC

## 2023-11-05 MED ORDER — BUPIVACAINE HCL (PF) 0.25 % IJ SOLN
INTRAMUSCULAR | Status: AC
  Filled 2023-11-05: qty 30

## 2023-11-05 MED ORDER — CEFAZOLIN SODIUM-DEXTROSE 2-4 GM/100ML-% IV SOLN
2.0000 g | INTRAVENOUS | Status: AC
  Administered 2023-11-05: 2 g via INTRAVENOUS
  Filled 2023-11-05: qty 100

## 2023-11-05 MED ORDER — ONDANSETRON HCL 4 MG/2ML IJ SOLN
INTRAMUSCULAR | Status: DC | PRN
  Administered 2023-11-05: 4 mg via INTRAVENOUS

## 2023-11-05 MED ORDER — BUPIVACAINE LIPOSOME 1.3 % IJ SUSP
INTRAMUSCULAR | Status: DC | PRN
  Administered 2023-11-05: 20 mL

## 2023-11-05 MED ORDER — DOCUSATE SODIUM 100 MG PO CAPS: 100.0000 mg | ORAL_CAPSULE | Freq: Two times a day (BID) | ORAL | 2 refills | Status: AC

## 2023-11-05 MED ORDER — OXYCODONE HCL 5 MG PO TABS: 5.0000 mg | ORAL_TABLET | ORAL | 0 refills | Status: AC | PRN

## 2023-11-05 MED ORDER — ROCURONIUM BROMIDE 10 MG/ML (PF) SYRINGE
PREFILLED_SYRINGE | INTRAVENOUS | Status: DC | PRN
  Administered 2023-11-05: 50 mg via INTRAVENOUS

## 2023-11-05 MED ORDER — ORAL CARE MOUTH RINSE: 15.0000 mL | Freq: Once | OROMUCOSAL | Status: AC

## 2023-11-05 MED ORDER — CHLORHEXIDINE GLUCONATE 0.12 % MT SOLN
15.0000 mL | Freq: Once | OROMUCOSAL | Status: AC
Start: 2023-11-05 — End: 2023-11-05
  Administered 2023-11-05: 15 mL via OROMUCOSAL
  Filled 2023-11-05: qty 15

## 2023-11-05 MED ORDER — BUPIVACAINE LIPOSOME 1.3 % IJ SUSP: 20.0000 mL | Freq: Once | INTRAMUSCULAR | Status: DC

## 2023-11-05 MED ORDER — PROPOFOL 10 MG/ML IV BOLUS
INTRAVENOUS | Status: DC | PRN
  Administered 2023-11-05: 110 mg via INTRAVENOUS

## 2023-11-05 MED ORDER — LIDOCAINE 2% (20 MG/ML) 5 ML SYRINGE
INTRAMUSCULAR | Status: DC | PRN
  Administered 2023-11-05: 100 mg via INTRAVENOUS

## 2023-11-05 MED ORDER — LACTATED RINGERS IV SOLN: INTRAVENOUS | Status: DC | PRN

## 2023-11-05 MED ORDER — BUPIVACAINE HCL 0.25 % IJ SOLN
INTRAMUSCULAR | Status: DC | PRN
  Administered 2023-11-05: 30 mL

## 2023-11-05 MED ORDER — MIDAZOLAM HCL 2 MG/2ML IJ SOLN
INTRAMUSCULAR | Status: AC
  Filled 2023-11-05: qty 2

## 2023-11-05 MED ORDER — SODIUM CHLORIDE 0.9 % IR SOLN
Status: DC | PRN
  Administered 2023-11-05: 1000 mL

## 2023-11-05 MED ORDER — FENTANYL CITRATE (PF) 250 MCG/5ML IJ SOLN
INTRAMUSCULAR | Status: DC | PRN
  Administered 2023-11-05: 50 ug via INTRAVENOUS
  Administered 2023-11-05: 150 ug via INTRAVENOUS
  Administered 2023-11-05: 50 ug via INTRAVENOUS

## 2023-11-05 MED ORDER — PROPOFOL 10 MG/ML IV BOLUS
INTRAVENOUS | Status: AC
Start: 2023-11-05 — End: ?
  Filled 2023-11-05: qty 20

## 2023-11-05 MED ORDER — FENTANYL CITRATE (PF) 100 MCG/2ML IJ SOLN: 25.0000 ug | INTRAMUSCULAR | Status: DC | PRN

## 2023-11-05 MED ORDER — OXYCODONE HCL 5 MG/5ML PO SOLN: 5.0000 mg | Freq: Once | ORAL | Status: DC | PRN

## 2023-11-05 SURGICAL SUPPLY — 40 items
APPLIER CLIP 5 13 M/L LIGAMAX5 (MISCELLANEOUS) ×2
BLADE CLIPPER SURG (BLADE) IMPLANT
CANISTER SUCT 3000ML PPV (MISCELLANEOUS) ×1 IMPLANT
CHLORAPREP W/TINT 26 (MISCELLANEOUS) ×1 IMPLANT
CLIP APPLIE 5 13 M/L LIGAMAX5 (MISCELLANEOUS) ×1 IMPLANT
COVER SURGICAL LIGHT HANDLE (MISCELLANEOUS) ×1 IMPLANT
DERMABOND ADVANCED .7 DNX12 (GAUZE/BANDAGES/DRESSINGS) ×1 IMPLANT
DEVICE TROCAR PUNCTURE CLOSURE (ENDOMECHANICALS) IMPLANT
DISSECTOR BLUNT TIP ENDO 5MM (MISCELLANEOUS) IMPLANT
ELECT CAUTERY BLADE 6.4 (BLADE) ×1 IMPLANT
ELECT REM PT RETURN 9FT ADLT (ELECTROSURGICAL) ×1
ELECTRODE REM PT RTRN 9FT ADLT (ELECTROSURGICAL) ×1 IMPLANT
GLOVE BIO SURGEON STRL SZ 6.5 (GLOVE) ×1 IMPLANT
GLOVE BIOGEL PI IND STRL 6 (GLOVE) ×1 IMPLANT
GOWN STRL REUS W/ TWL LRG LVL3 (GOWN DISPOSABLE) ×3 IMPLANT
GOWN STRL REUS W/TWL LRG LVL3 (GOWN DISPOSABLE) ×3
IRRIG SUCT STRYKERFLOW 2 WTIP (MISCELLANEOUS)
IRRIGATION SUCT STRKRFLW 2 WTP (MISCELLANEOUS) IMPLANT
KIT BASIN OR (CUSTOM PROCEDURE TRAY) ×1 IMPLANT
KIT IMAGING PINPOINTPAQ (MISCELLANEOUS) IMPLANT
KIT TURNOVER KIT B (KITS) ×1 IMPLANT
NDL INSUFFLATION 14GA 120MM (NEEDLE) IMPLANT
NEEDLE INSUFFLATION 14GA 120MM (NEEDLE) ×1
NS IRRIG 1000ML POUR BTL (IV SOLUTION) ×1 IMPLANT
PAD ARMBOARD 7.5X6 YLW CONV (MISCELLANEOUS) ×1 IMPLANT
PENCIL BUTTON HOLSTER BLD 10FT (ELECTRODE) ×1 IMPLANT
POUCH RETRIEVAL ECOSAC 10 (ENDOMECHANICALS) ×1 IMPLANT
SCISSORS LAP 5X35 DISP (ENDOMECHANICALS) ×1 IMPLANT
SET TUBE SMOKE EVAC HIGH FLOW (TUBING) ×1 IMPLANT
SLEEVE Z-THREAD 5X100MM (TROCAR) ×2 IMPLANT
SUT MNCRL AB 4-0 PS2 18 (SUTURE) ×1 IMPLANT
SUT VIC AB 0 UR5 27 (SUTURE) IMPLANT
SUT VICRYL 0 AB UR-6 (SUTURE) IMPLANT
TOWEL GREEN STERILE FF (TOWEL DISPOSABLE) ×1 IMPLANT
TRAY LAPAROSCOPIC MC (CUSTOM PROCEDURE TRAY) ×1 IMPLANT
TROCAR BALLN 12MMX100 BLUNT (TROCAR) ×1 IMPLANT
TROCAR Z THREAD OPTICAL 12X100 (TROCAR) IMPLANT
TROCAR Z-THREAD OPTICAL 5X100M (TROCAR) ×1 IMPLANT
WARMER LAPAROSCOPE (MISCELLANEOUS) ×1 IMPLANT
WATER STERILE IRR 1000ML POUR (IV SOLUTION) ×1 IMPLANT

## 2023-11-05 NOTE — Discharge Instructions (Addendum)
PH?U THU?T TRUNG TM CCS Potter, P.A.  Ph?u thu?t n?i soi: POST H??NG D?N OP Lun xem l?i t? h??ng d?n xu?t vi?n do c? s? n?i b?n th?c hi?n ph?u thu?t cung c?p cho b?n. N?U B?N C KHUY?T T?T HO?C BI?U M?U L?I NGH? GIA ?NH, B?N PH?I MANG ??N V?N PHNG ?? X? L. ??NG ??A CHNG CHO BC S? C?A B?N.  KI?M SOT ?AU  1. Phc ?? gi?m ?au: u?ng tylenol (acetaminophen) khng k ??n 1000mg  m?i su gi?, ibuprofen theo toa (600mg ) m?i su gi? v robaxin (methocarbamol) 750mg  m?i su gi?Marland Kitchen V?i c? ba ?i?u ny, b?n nn u?ng th? g ? c? sau hai gi?. V d?: tylenol (acetaminophen) lc 8 gi? sng, ibuprofen lc 10 gi? sng, robaxin (methocarbamol) lc 12 gi? tr?a, tylenol (acetaminophen) n?a lc 2 gi? chi?u, l?i ibuprofen lc 4 gi? chi?u, robaxin (methocarbamol) lc 6 gi? chi?u. B?n c?ng c ??n thu?c oxycodone, nn dng n?u tylenol (acetaminophen), ibuprofen v robaxin (methocarbamol) khng ?? ?? ki?m sot c?n ?au c?a b?n. B?n c th? dng oxycodone th??ng xuyn c? sau b?n gi? n?u c?n, nh?ng n?u b?n ?ang dng cc lo?i thu?c khc nh? trn, b?n khng nn dng oxycodone th??ng xuyn nh? v?y. B?n c?ng ? ???c k ??n thu?c colace (docusate) l ch?t lm m?m phn. Vui lng dng thu?c ny theo ch? ??nh v oxycodone c th? gy to bn v colace (d Focusate) s? gi?m thi?u ho?c ng?n ng?a to bn. Khng li xe khi ?ang dng ho?c ch?u ?nh h??ng c?a oxycodone v ?y l thu?c gy nghi?n. 2. Ch??m ? ?? gip ki?m sot c?n ?au. 3. N?u b?n c?n mua thm thu?c gi?m ?au, vui lng lin h? v?i nh thu?c c?a b?n. H? s? lin h? v?i v?n phng c?a chng ti ?? yu c?u ?y quy?n. ??n thu?c s? khng ???c c?p sau 5 gi? chi?u ho?c vo cu?i tu?n.  THU?C T?I NH 4. Dng cc lo?i thu?c th??ng ???c k ??n tr? khi c ch? d?n khc.  ?N KING 5. B?n nn ?n king nh? trong vi ngy ??u sau khi v? nh. Hy ch?c ch?n bao g?m nhi?u ch?t l?ng hng ngy.  TO BN 6. B?n th??ng b? to bn sau ph?u thu?t v sau khi dng thu?c gi?m ?au. T?ng l??ng ch?t l?ng v  dng thu?c lm m?m phn (ch?ng h?n nh? Colace) th??ng s? gip ch ho?c ng?n ng?a v?n ?? ny x?y ra. Nn dng thu?c nhu?n trng nh? (S?a Magnesia ho?c Miralax) theo h??ng d?n trn bao b n?u khng ?i tiu sau 48 gi?.  CH?M [place] V?t th??ng/V?t m? 7. H?u h?t b?nh nhn s? b? s?ng v b?m tm ? vng v?t m?. Ti n??c ? s? gip ch. S?ng v b?m tm c th? m?t vi ngy ?? gi?i quy?t. 8. Thng 5 b?t ??u t?m 11/06/2023. 9. Khng bc ho?c ch xt l?p keo trn da. C th? ?? n??c x phng ?m ch?y qua v?t m?, sau ? r?a s?ch v lau kh. 10. Khng ngm mnh trong b?t k? lo?i n??c no (b?n t?m, b?n n??c nng, h? b?i, h?, ??i d??ng) trong m?t tu?n.  CC HO?T ??NG 11. B?n c th? ti?p t?c cc ho?t ??ng th??ng ngy (nh? nhng) b?t ??u vo ngy hm sau--ch?ng h?n nh? t? ch?m [place] b?n thn hng ngy, ?i b?, leo c?u thang--t?ng d?n cc ho?t ??ng n?u c th? ch?u ??ng ???c. B?n c th? quan h? tnh d?c khi th?y tho?i mi. 12. Khng nng v?t n?ng  qu 5 pound trong su tu?n. 13. B?n c th? li xe khi khng cn dng thu?c gi?m ?au gy m, b?n c th? tho?i mi th?t dy an ton v c th? ?i?u khi?n xe m?t cch an ton v s? d?ng phanh.  THEO ST 14. B?n nn ??n g?p bc s? t?i phng khm ?? ti khm kho?ng 2-3 tu?n sau khi ph?u thu?t. L? ra b?n ph?i ???c h?n khm h?u ph?u/theo di khi cu?c ph?u thu?t c?a b?n ???c ln l?ch. N?u b?n khng nh?n ???c cu?c h?n h?u ph?u/theo di, hy ??m b?o r?ng b?n g?i ??n cu?c h?n ny trong vng m?t ho?c hai ngy sau khi b?n v? ??n nh ?? ??m b?o th?i gian h?n thu?n ti?n.  KHI NO C?N G?I BC S? C?A B?N: 1. S?t trn 101,5 2. Khng th? ?i ti?u 3. Ti?p t?c ch?y mu t? v?t m?. 4. ?au nhi?u h?n, t?y ?? ho?c ch?y d?ch t? v?t m?. 5. ?au b?ng ngy cng t?ng  Nhn vin phng khm s?n sng tr? l?i cc cu h?i c?a b?n trong gi? lm vi?c thng th??ng. Vui lng g?i ?i?n v yu c?u ???c ni chuy?n v?i m?t trong cc y t n?u c th?c m?c v? lm sng. N?u b?n g?p tr??ng h?p c?p c?u y t?, hy ??n phng c?p c?u  g?n nh?t ho?c g?i 911. Bc s? ph?u thu?t c?a [facility] lun tc tr?c t?i b?nh vi?n.   [address], [place], [place] [address]  [address] [address], [place], [place] [address] [phone]  FAX [phone] [place] web: [email or web address]

## 2023-11-05 NOTE — H&P (Addendum)
   Sandra Koch is an 22 y.o. female.   HPI: 50F with symptomatic cholelithiasis. The patient has had no hospitalizations, doctors visits, ER visits, surgeries, or newly diagnosed allergies since being seen in the office.    History reviewed. No pertinent past medical history.  Past Surgical History:  Procedure Laterality Date   NO PAST SURGERIES      Family History  Problem Relation Age of Onset   Hypertension Mother    Diabetes Mother    Colon cancer Neg Hx    Esophageal cancer Neg Hx    Pancreatic cancer Neg Hx    Stomach cancer Neg Hx     Social History:  reports that she has never smoked. She has never used smokeless tobacco. She reports current alcohol use. She reports that she does not use drugs.  Allergies: No Known Allergies  Medications: I have reviewed the patient's current medications.  Results for orders placed or performed during the hospital encounter of 11/05/23 (from the past 48 hour(s))  CBC per protocol     Status: None   Collection Time: 11/05/23 11:30 AM  Result Value Ref Range   WBC 6.0 4.0 - 10.5 K/uL   RBC 4.70 3.87 - 5.11 MIL/uL   Hemoglobin 13.6 12.0 - 15.0 g/dL   HCT 16.1 09.6 - 04.5 %   MCV 89.4 80.0 - 100.0 fL   MCH 28.9 26.0 - 34.0 pg   MCHC 32.4 30.0 - 36.0 g/dL   RDW 40.9 81.1 - 91.4 %   Platelets 267 150 - 400 K/uL   nRBC 0.0 0.0 - 0.2 %    Comment: Performed at [facility], [address] [place], [place] [address]    No results found.  ROS 10 point review of systems is negative except as listed above in HPI.   Physical Exam Blood pressure 116/69, pulse 71, temperature 98.2 F (36.8 C), temperature source Oral, resp. rate 18, height 5\' 2"  (1.575 m), weight 72.6 kg, last menstrual period 10/29/2023, SpO2 100%. Constitutional: well-developed, well-nourished HEENT: pupils equal, round, reactive to light, 2mm b/l, moist conjunctiva, external inspection of ears and nose normal, hearing intact Oropharynx: normal  oropharyngeal mucosa, normal dentition Neck: no thyromegaly, trachea midline, no midline cervical tenderness to palpation Chest: breath sounds equal bilaterally, normal respiratory effort, no midline or lateral chest wall tenderness to palpation/deformity Abdomen: soft, NT, no bruising, no hepatosplenomegaly Skin: warm, dry, no rashes Psych: normal memory, normal mood/affect     Assessment/Plan: Symptomatic cholelithiasis - plan lap chole. Informed consent was obtained after detailed explanation of risks, including bleeding, infection, biloma, hematoma, injury to common bile duct, need for IOC to delineate anatomy, and need for conversion to open procedure. All questions answered to the patient's satisfaction. History, exam, and informed consent obtained in [place] using in-person interpreter. FEN - strict NPO DVT - SCDs, LMWH Dispo -  d/c home post-op     Diamantina Monks, MD General and Trauma Surgery [facility]

## 2023-11-05 NOTE — Transfer of Care (Signed)
Immediate Anesthesia Transfer of Care Note  Patient: Sandra Koch  Procedure(s) Performed: LAPAROSCOPIC CHOLECYSTECTOMY (Abdomen)  Patient Location: PACU  Anesthesia Type:General  Level of Consciousness: alert , oriented, and sedated  Airway & Oxygen Therapy: Patient Spontanous Breathing and Patient connected to face mask oxygen  Post-op Assessment: Report given to RN and Post -op Vital signs reviewed and stable  Post vital signs: Reviewed and stable  Last Vitals:  Vitals Value Taken Time  BP 125/58 11/05/23 1530  Temp 36.5 C 11/05/23 1530  Pulse 46 11/05/23 1532  Resp 19 11/05/23 1532  SpO2 99 % 11/05/23 1532  Vitals shown include unfiled device data.  Last Pain:  Vitals:   11/05/23 1530  TempSrc:   PainSc: Asleep         Complications: No notable events documented.

## 2023-11-05 NOTE — Op Note (Signed)
   Operative Note  Date: 11/05/2023  Procedure: laparoscopic cholecystectomy  Pre-op diagnosis: symptomatic cholelithiasis Post-op diagnosis: same  Indication and clinical history: The patient is a 22 y.o. year old female with symptomatic cholelithiasis  Surgeon: Diamantina Monks, MD  Anesthesiologist: Okey Dupre, MD Anesthesia: General  Findings:  Specimen: gallbladder EBL: <5cc Drains/Implants: none  Disposition: PACU - hemodynamically stable.  Description of procedure: The patient was positioned supine on the operating room table. Time-out was performed verifying correct patient, procedure, signature of informed consent, and administration of pre-operative antibiotics. General anesthetic induction and intubation were uneventful. The abdomen was prepped and draped in the usual sterile fashion. An infra-umbilical incision was made using an open technique using zero vicryl stay sutures on either side of the fascia and a 10mm Hassan port inserted. After establishing pneumoperitoneum, which the patient tolerated well, the abdominal cavity was inspected and no injury of any intra-abdominal structures was identified. Additional ports were placed under direct visualization and using local anesthetic: two 5mm ports in the right subcostal region and a 5mm port in the epigastric region. The patient was re-positioned to reverse Trendelenburg and right side up. Adhesiolysis was performed to expose the gallbladder, which was then retracted cephalad. The infundibulum was identified and retracted toward the right lower quadrant. The peritoneum was incised over the infundibulum and the triangle of Calot dissected to expose the critical view of safety. With clear identification and isolation of the cystic duct and cystic artery, the cystic artery was doubly clipped and divided. After this, the cystic duct was identified as a single structure entering the gallbladder, and was also doubly clipped and divided. The  gallbladder was dissected off the liver bed using electrocautery and hemostasis of the liver bed was confirmed prior to separation of the final peritoneal attachments of the gallbladder to the liver bed. The gallbladder fossa was irrigated and fluid returned clear. After transection of the final peritoneal attachments, the gallbladder was placed in an endoscopic specimen retrieval bag, removed via the umbilical port site, and sent to pathology as a permanent specimen. The gallbladder fossa was inspected confirming hemostasis, the absence of bile leakage from the cystic duct stump, and correct placement of clips on the cystic artery and cystic duct stumps. The abdomen was desufflated and the fascia of the umbilical port site was closed using the previously placed stay sutures. Additional local anesthetic was administered at the umbilical port site.  The skin of all incisions was closed with 4-0 monocryl. Sterile dressings were applied. All sponge and instrument counts were correct at the conclusion of the procedure. The patient was awakened from anesthesia, extubated uneventfully, and transported to the PACU - hemodynamically stable.. There were no complications.     Diamantina Monks, MD General and Trauma Surgery [facility]

## 2023-11-05 NOTE — Anesthesia Postprocedure Evaluation (Signed)
Anesthesia Post Note  Patient: Sandra Koch [name]  Procedure(s) Performed: LAPAROSCOPIC CHOLECYSTECTOMY (Abdomen)     Patient location during evaluation: PACU Anesthesia Type: General Level of consciousness: awake and alert Pain management: pain level controlled Vital Signs Assessment: post-procedure vital signs reviewed and stable Respiratory status: spontaneous breathing, nonlabored ventilation, respiratory function stable and patient connected to nasal cannula oxygen Cardiovascular status: blood pressure returned to baseline and stable Postop Assessment: no apparent nausea or vomiting Anesthetic complications: no  No notable events documented.  Last Vitals:  Vitals:   11/05/23 1545 11/05/23 1600  BP: 131/72 125/74  Pulse: (!) 48 (!) 49  Resp: 19 18  Temp:    SpO2: 98% 97%    Last Pain:  Vitals:   11/05/23 1530  TempSrc:   PainSc: Asleep                 [name]

## 2023-11-05 NOTE — Anesthesia Procedure Notes (Signed)
Procedure Name: Intubation Date/Time: 11/05/2023 2:15 PM  Performed by: Hali Marry, CRNAPre-anesthesia Checklist: Patient identified, Emergency Drugs available, Suction available and Patient being monitored Patient Re-evaluated:Patient Re-evaluated prior to induction Oxygen Delivery Method: Circle system utilized Preoxygenation: Pre-oxygenation with 100% oxygen Induction Type: IV induction Ventilation: Mask ventilation without difficulty Laryngoscope Size: Mac and 3 Grade View: Grade I Tube type: Oral Number of attempts: 1 Airway Equipment and Method: Stylet and Oral airway Placement Confirmation: ETT inserted through vocal cords under direct vision, positive ETCO2 and breath sounds checked- equal and bilateral Secured at: 21 cm Tube secured with: Tape Dental Injury: Teeth and Oropharynx as per pre-operative assessment

## 2023-11-06 ENCOUNTER — Encounter (HOSPITAL_COMMUNITY): Payer: Self-pay | Admitting: Surgery

## 2023-11-07 LAB — SURGICAL PATHOLOGY

## 2024-09-07 DIAGNOSIS — Z6832 Body mass index (BMI) 32.0-32.9, adult: Secondary | ICD-10-CM | POA: Diagnosis not present

## 2024-09-07 DIAGNOSIS — Z23 Encounter for immunization: Secondary | ICD-10-CM | POA: Diagnosis not present

## 2024-09-07 DIAGNOSIS — Z124 Encounter for screening for malignant neoplasm of cervix: Secondary | ICD-10-CM | POA: Diagnosis not present

## 2024-09-07 DIAGNOSIS — Z0000 Encounter for general adult medical examination without abnormal findings: Secondary | ICD-10-CM | POA: Diagnosis not present

## 2024-12-02 DIAGNOSIS — N926 Irregular menstruation, unspecified: Secondary | ICD-10-CM | POA: Diagnosis not present

## 2024-12-02 DIAGNOSIS — Z0000 Encounter for general adult medical examination without abnormal findings: Secondary | ICD-10-CM | POA: Diagnosis not present

## 2024-12-02 DIAGNOSIS — Z13220 Encounter for screening for lipoid disorders: Secondary | ICD-10-CM | POA: Diagnosis not present

## 2024-12-02 DIAGNOSIS — R1013 Epigastric pain: Secondary | ICD-10-CM | POA: Diagnosis not present

## 2024-12-28 ENCOUNTER — Telehealth: Payer: Self-pay | Admitting: Family Medicine

## 2024-12-28 NOTE — Telephone Encounter (Signed)
 Patient called to schedule a Nurse visit for Pregnancy Test. During our call, I informed the patient that her insurance is out of network which means that she will be responsible to pay large bills from our office. Patient states that she wants to keep her appointment and verbally agreed to cover the cost. 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 I informed her that she can also call her insurance to figure out what offices are in network with her plan.

## 2025-01-05 ENCOUNTER — Ambulatory Visit
